# Patient Record
Sex: Male | Born: 1960 | Race: White | Hispanic: No | Marital: Married | Smoking: Never smoker
Health system: Southern US, Community
[De-identification: ages and names within clinical notes are randomized; demographics above are authoritative.]

## PROBLEM LIST (undated history)

## (undated) DIAGNOSIS — R079 Chest pain, unspecified: Secondary | ICD-10-CM

## (undated) DIAGNOSIS — G4733 Obstructive sleep apnea (adult) (pediatric): Secondary | ICD-10-CM

## (undated) DIAGNOSIS — R7303 Prediabetes: Secondary | ICD-10-CM

## (undated) DIAGNOSIS — E119 Type 2 diabetes mellitus without complications: Secondary | ICD-10-CM

## (undated) DIAGNOSIS — N5202 Corporo-venous occlusive erectile dysfunction: Secondary | ICD-10-CM

## (undated) DIAGNOSIS — E782 Mixed hyperlipidemia: Secondary | ICD-10-CM

## (undated) HISTORY — DX: Corporo-venous occlusive erectile dysfunction: N52.02

## (undated) HISTORY — DX: Type 2 diabetes mellitus without complications: E11.9

## (undated) HISTORY — DX: Morbid (severe) obesity due to excess calories: E66.01

## (undated) HISTORY — DX: Mixed hyperlipidemia: E78.2

## (undated) HISTORY — DX: Obstructive sleep apnea (adult) (pediatric): G47.33

## (undated) HISTORY — PX: NASAL SEPTUM SURGERY: SHX37

## (undated) HISTORY — DX: Chest pain, unspecified: R07.9

## (undated) HISTORY — PX: DENTAL SURGERY: SHX609

---

## 2014-05-21 DIAGNOSIS — G4733 Obstructive sleep apnea (adult) (pediatric): Secondary | ICD-10-CM

## 2014-05-21 DIAGNOSIS — E782 Mixed hyperlipidemia: Secondary | ICD-10-CM

## 2014-05-21 HISTORY — DX: Morbid (severe) obesity due to excess calories: E66.01

## 2014-05-21 HISTORY — DX: Obstructive sleep apnea (adult) (pediatric): G47.33

## 2014-05-21 HISTORY — DX: Mixed hyperlipidemia: E78.2

## 2014-06-04 DIAGNOSIS — E119 Type 2 diabetes mellitus without complications: Secondary | ICD-10-CM | POA: Insufficient documentation

## 2014-06-04 HISTORY — DX: Type 2 diabetes mellitus without complications: E11.9

## 2015-04-21 DIAGNOSIS — N5202 Corporo-venous occlusive erectile dysfunction: Secondary | ICD-10-CM

## 2015-04-21 HISTORY — DX: Corporo-venous occlusive erectile dysfunction: N52.02

## 2017-10-16 ENCOUNTER — Encounter (HOSPITAL_COMMUNITY): Payer: Self-pay | Admitting: Emergency Medicine

## 2017-10-16 ENCOUNTER — Ambulatory Visit (INDEPENDENT_AMBULATORY_CARE_PROVIDER_SITE_OTHER)
Admission: EM | Admit: 2017-10-16 | Discharge: 2017-10-16 | Disposition: A | Discharge status: Home / Self Care | Payer: BLUE CROSS/BLUE SHIELD | Source: Home / Self Care

## 2017-10-16 ENCOUNTER — Emergency Department (HOSPITAL_COMMUNITY): Payer: BLUE CROSS/BLUE SHIELD

## 2017-10-16 ENCOUNTER — Other Ambulatory Visit: Payer: Self-pay

## 2017-10-16 ENCOUNTER — Emergency Department (HOSPITAL_COMMUNITY)
Admission: EM | Admit: 2017-10-16 | Discharge: 2017-10-17 | Disposition: A | Discharge status: Home / Self Care | Payer: BLUE CROSS/BLUE SHIELD | Attending: Emergency Medicine | Admitting: Emergency Medicine

## 2017-10-16 DIAGNOSIS — R079 Chest pain, unspecified: Secondary | ICD-10-CM | POA: Insufficient documentation

## 2017-10-16 DIAGNOSIS — M79602 Pain in left arm: Secondary | ICD-10-CM

## 2017-10-16 HISTORY — DX: Prediabetes: R73.03

## 2017-10-16 LAB — CBC
HCT: 48 % (ref 39.0–52.0)
Hemoglobin: 15.8 g/dL (ref 13.0–17.0)
MCH: 28.6 pg (ref 26.0–34.0)
MCHC: 32.9 g/dL (ref 30.0–36.0)
MCV: 86.8 fL (ref 78.0–100.0)
Platelets: 235 10*3/uL (ref 150–400)
RBC: 5.53 MIL/uL (ref 4.22–5.81)
RDW: 13.4 % (ref 11.5–15.5)
WBC: 8.2 10*3/uL (ref 4.0–10.5)

## 2017-10-16 LAB — BASIC METABOLIC PANEL
Anion gap: 13 (ref 5–15)
BUN: 10 mg/dL (ref 6–20)
CHLORIDE: 101 mmol/L (ref 98–111)
CO2: 24 mmol/L (ref 22–32)
CREATININE: 0.86 mg/dL (ref 0.61–1.24)
Calcium: 9.6 mg/dL (ref 8.9–10.3)
GFR calc non Af Amer: >60 mL/min (ref >60)
Glucose, Bld: 214 mg/dL — ABNORMAL HIGH (ref 70–99)
Potassium: 4 mmol/L (ref 3.5–5.1)
Sodium: 138 mmol/L (ref 135–145)

## 2017-10-16 LAB — I-STAT TROPONIN, ED: Troponin i, poc: 0.03 ng/mL (ref 0.00–0.08)

## 2017-10-16 NOTE — ED Provider Notes (Signed)
Patient placed in Quick Look pathway, seen and evaluated   Chief Complaint: chest pain  HPI:   Presents with acute onset left sided chest pain radiating to left arm beginning this morning around 9am at rest. Pain is described as cramping, tightness. Lightheadedness and diaphoresis, no syncope. Endorses SOB, no nausea or vomiting. Denies fevers. Non-smoker. Has taken 4 full size ASA with improvement.  Denies leg swelling.  No risk factors for DVT.  ROS:   Physical Exam:   Gen: No distress  Neuro: Awake and Alert  Skin: Warm    Focused Exam: Heart regular rate and rhythm.  No murmurs rubs or gallops.  Lungs clear to auscultation bilaterally.  No tenderness to palpation of the chest wall. 2+ radial and DP/PT pulses bilaterally, Homans sign absent bilaterally, no lower extremity edema, no palpable cords, compartments are soft   Initiation of care has begun. The patient has been counseled on the process, plan, and necessity for staying for the completion/evaluation, and the remainder of the medical screening examination    Bennye Alm 10/16/17 1707    Lorre Nick, MD 10/17/17 [phone]

## 2017-10-16 NOTE — ED Provider Notes (Signed)
[facility] EMERGENCY DEPARTMENT Provider Note   CSN: [id number] Arrival date & time: 10/16/17  1645     History   Chief Complaint Chief Complaint  Patient presents with  . Chest Pain    HPI Skyy Messerly is a 57 y.o. male.  Patient is a 57 year old male with no significant past medical history.  He presents for evaluation of chest discomfort.  This started approximately 9 AM this morning shortly after he arrived to work.  He reports a cramping in the left center of his chest with radiation into his left arm.  He reports pain in his left calf that has been ongoing for the past several weeks.  Denies any recent exertional symptoms.  He works as a Investment banker, corporate.  He reports this is a stressful job, but is not physically demanding.  He does report having a stress test 4 or 5 years ago that was unremarkable.  The history is provided by the patient.  Chest Pain   This is a new problem. Episode onset: 9AM. The problem occurs constantly. The problem has been gradually improving. The pain is associated with breathing and movement. The pain is present in the substernal region. The pain is moderate. Quality: cramping. The pain radiates to the left arm. Pertinent negatives include no diaphoresis, no fever, no palpitations and no shortness of breath. He has tried nothing for the symptoms. The treatment provided no relief. There are no known risk factors.    Past Medical History:  Diagnosis Date  . Prediabetes     There are no active problems to display for this patient.   History reviewed. No pertinent surgical history.      Home Medications    Prior to Admission medications   Not on File    Family History No family history on file.  Social History Social History   Tobacco Use  . Smoking status: Never Smoker  . Smokeless tobacco: Never Used  Substance Use Topics  . Alcohol use: Never    Frequency: Never  . Drug use: Never     Allergies     Patient has no known allergies.   Review of Systems Review of Systems  Constitutional: Negative for diaphoresis and fever.  Respiratory: Negative for shortness of breath.   Cardiovascular: Positive for chest pain. Negative for palpitations.  All other systems reviewed and are negative.    Physical Exam Updated Vital Signs BP (!) 151/90 (BP Location: Left Arm)   Pulse 69   Temp 97.6 F (36.4 C) (Oral)   Resp 19   Ht 6\' 1"  (1.854 m)   Wt 136.1 kg   SpO2 96%   BMI 39.58 kg/m   Physical Exam  Constitutional: He is oriented to person, place, and time. He appears well-developed and well-nourished. No distress.  HENT:  Head: Normocephalic and atraumatic.  Mouth/Throat: Oropharynx is clear and moist.  Neck: Normal range of motion. Neck supple.  Cardiovascular: Normal rate and regular rhythm. Exam reveals no friction rub.  No murmur heard. Pulmonary/Chest: Effort normal and breath sounds normal. No respiratory distress. He has no wheezes. He has no rales.  Abdominal: Soft. Bowel sounds are normal. He exhibits no distension. There is no tenderness.  Musculoskeletal: Normal range of motion. He exhibits no edema.       Right lower leg: Normal. He exhibits no tenderness and no edema.       Left lower leg: Normal. He exhibits tenderness. He exhibits no edema.  There is  tenderness in the left leg.  Homans sign is absent.  Neurological: He is alert and oriented to person, place, and time. Coordination normal.  Skin: Skin is warm and dry. He is not diaphoretic.  Nursing note and vitals reviewed.    ED Treatments / Results  Labs (all labs ordered are listed, but only abnormal results are displayed) Labs Reviewed  BASIC METABOLIC PANEL - Abnormal; Notable for the following components:      Result Value   Glucose, Bld 214 (*)    All other components within normal limits  CBC  D-DIMER, QUANTITATIVE (NOT AT [facility])  I-STAT TROPONIN, ED  I-STAT TROPONIN, ED    EKG EKG  Interpretation  Date/Time:  Tuesday October 16 2017 16:53:27 EDT Ventricular Rate:  80 PR Interval:  162 QRS Duration: 80 QT Interval:  346 QTC Calculation: 399 R Axis:   24 Text Interpretation:  Normal sinus rhythm Normal ECG Confirmed by Geoffery Lyons ([id number]) on 10/16/2017 11:35:51 PM   Radiology Dg Chest 2 View  Result Date: 10/16/2017 CLINICAL DATA:  Chest pain EXAM: CHEST - 2 VIEW COMPARISON:  None. FINDINGS: There is slight atelectatic change in the left lower lobe. There is no edema or consolidation. The heart size and pulmonary vascularity are normal. No adenopathy. No pneumothorax. No bone lesions. IMPRESSION: Slight atelectasis left lower lobe.  No edema or consolidation. Electronically Signed   By: Bretta Bang III M.D.   On: 10/16/2017 17:40    Procedures Procedures (including critical care time)  Medications Ordered in ED Medications - No data to display   Initial Impression / Assessment and Plan / ED Course  I have reviewed the triage vital signs and the nursing notes.  Pertinent labs & imaging results that were available during my care of the patient were reviewed by me and considered in my medical decision making (see chart for details).  Patient is a 57 year old male with no prior cardiac history presenting with complaints of left-sided chest discomfort that is been ongoing since earlier this morning.  He describes this as a cramping with no shortness of breath, diaphoresis, nausea.    His work-up reveals no obvious abnormality including EKG, troponin x2, and d-dimer.  There is no hypoxia or tachycardia and I highly doubt PE.  At this point, I feel as though the patient is appropriate for discharge.  He has had a stress test several years ago and may benefit from outpatient cardiology follow-up.  He will be given the number for the [facility] health cardiology clinic to arrange outpatient follow-up.  He understands to return in the meantime if symptoms worsen or  change.  Final Clinical Impressions(s) / ED Diagnoses   Final diagnoses:  None    ED Discharge Orders    None       Geoffery Lyons, MD 10/17/17 0120

## 2017-10-16 NOTE — ED Triage Notes (Signed)
Pt reports left cp and cramping that radiated to left arm that started this morning around 9am. Pt also reports left calf cramping that has been going on for two weeks and pain in leg today. Pt reports taking about 1300mg  of asa throughout the day.

## 2017-10-16 NOTE — ED Triage Notes (Signed)
Pt c/o chest pain, cramping, L arm and leg pain. Family hx of blood clots. EKG obtained, given to Dr. Tracie Harrier, sent to ER.

## 2017-10-17 LAB — D-DIMER, QUANTITATIVE: D-Dimer, Quant: <0.27 ug/mL-FEU (ref 0.00–0.50)

## 2017-10-17 LAB — I-STAT TROPONIN, ED: Troponin i, poc: 0.03 ng/mL (ref 0.00–0.08)

## 2017-10-17 NOTE — Discharge Instructions (Addendum)
Ibuprofen 600 mg 3 times daily for the next 3 days.  Rest.  Follow-up with cardiology to discuss possible stress testing.  The contact information for the [facility] health radiology group has been provided in this discharge summary for you to call and make these arrangements.  Return to the ER in the meantime if your symptoms significantly worsen or change.

## 2017-10-17 NOTE — ED Notes (Signed)
Pt unable to sign for discharge 

## 2017-10-23 DIAGNOSIS — R079 Chest pain, unspecified: Secondary | ICD-10-CM | POA: Insufficient documentation

## 2017-10-23 HISTORY — DX: Chest pain, unspecified: R07.9

## 2017-10-23 NOTE — Progress Notes (Deleted)
Cardiology Office Note:    Date:  10/23/2017   ID:  Nathan Blankenship, DOB 1960/06/17, MRN [id number]  PCP:  Aubery Lapping, PA  Cardiologist:  Norman Herrlich, MD   Referring MD: Aubery Lapping, PA  ASSESSMENT:    No diagnosis found. PLAN:    In order of problems listed above:  1. ***  Next appointment   Medication Adjustments/Labs and Tests Ordered: Current medicines are reviewed at length with the patient today.  Concerns regarding medicines are outlined above.  No orders of the defined types were placed in this encounter.  No orders of the defined types were placed in this encounter.    No chief complaint on file. ***  History of Present Illness:    Nathan Blankenship is a 57 y.o. male who is being seen today for the evaluation of *** at the request of Aubery Lapping, PA. He was seen at [facility] emergency room 10/16/2017 for what was felt to be nonanginal chest pain his EKG was normal cardiac troponins were normal x2 and was discharged from the emergency room.  D-dimer is also normal felt to be at low risk for pulmonary embolism  Past Medical History:  Diagnosis Date  . Prediabetes     No past surgical history on file.  Current Medications: No outpatient medications have been marked as taking for the 10/24/17 encounter (Appointment) with Baldo Daub, MD.     Allergies:   Patient has no known allergies.   Social History   Socioeconomic History  . Marital status: Married    Spouse name: Not on file  . Number of children: Not on file  . Years of education: Not on file  . Highest education level: Not on file  Occupational History  . Not on file  Social Needs  . Financial resource strain: Not on file  . Food insecurity:    Worry: Not on file    Inability: Not on file  . Transportation needs:    Medical: Not on file    Non-medical: Not on file  Tobacco Use  . Smoking status: Never Smoker  . Smokeless tobacco: Never Used    Substance and Sexual Activity  . Alcohol use: Never    Frequency: Never  . Drug use: Never  . Sexual activity: Not on file  Lifestyle  . Physical activity:    Days per week: Not on file    Minutes per session: Not on file  . Stress: Not on file  Relationships  . Social connections:    Talks on phone: Not on file    Gets together: Not on file    Attends religious service: Not on file    Active member of club or organization: Not on file    Attends meetings of clubs or organizations: Not on file    Relationship status: Not on file  Other Topics Concern  . Not on file  Social History Narrative  . Not on file     Family History: The patient's ***family history is not on file.  ROS:   ROS Please see the history of present illness.    *** All other systems reviewed and are negative.  EKGs/Labs/Other Studies Reviewed:    The following studies were reviewed today: ***  EKG:  EKG is *** ordered today.  The ekg ordered today demonstrates ***  Recent Labs: 10/16/2017: BUN 10; Creatinine, Ser 0.86; Hemoglobin 15.8; Platelets 235; Potassium 4.0; Sodium 138  Recent Lipid Panel No  results found for: CHOL, TRIG, HDL, CHOLHDL, VLDL, LDLCALC, LDLDIRECT  Physical Exam:    VS:  There were no vitals taken for this visit.    Wt Readings from Last 3 Encounters:  10/16/17 300 lb (136.1 kg)     GEN: *** Well nourished, well developed in no acute distress HEENT: Normal NECK: No JVD; No carotid bruits LYMPHATICS: No lymphadenopathy CARDIAC: ***RRR, no murmurs, rubs, gallops RESPIRATORY:  Clear to auscultation without rales, wheezing or rhonchi  ABDOMEN: Soft, non-tender, non-distended MUSCULOSKELETAL:  No edema; No deformity  SKIN: Warm and dry NEUROLOGIC:  Alert and oriented x 3 PSYCHIATRIC:  Normal affect     Signed, Norman HerrlichBrian Zylie Mumaw, MD  10/23/2017 9:19 AM    [facility]

## 2017-10-24 ENCOUNTER — Ambulatory Visit: Payer: BLUE CROSS/BLUE SHIELD | Admitting: Cardiology

## 2017-10-24 NOTE — Progress Notes (Signed)
Cardiology Office Note:    Date:  10/25/2017   ID:  Nathan Adamerrance Awad, DOB 1960-12-20, MRN [id number]  PCP:  Aubery LappingGeigler, Bryan James, PA  Cardiologist:  Norman HerrlichBrian Esti Demello, MD   Referring MD: Aubery LappingGeigler, Bryan James, [place]  ASSESSMENT:    1. Chest pain in adult   2. Type 2 diabetes mellitus without complication, without long-term current use of insulin (HCC)   3. Multiple-type hyperlipidemia    PLAN:    In order of problems listed above:  1. His symptoms were atypical somewhat pleuritic in retrospect likely likely part of his upper respiratory infection.  He is at high risk of coronary artery disease I asked him to undergo stress echo and check laboratory test including lipids with a history of hyperlipidemia hemoglobin A1c and a sedimentation rate regarding pleuritic chest pain.  He does not have risk factors and I do not think he needs to undergo evaluation for venous thromboembolism. 2. He has had no follow-up his blood sugars over 200 in the ED and will check a hemoglobin A1c. 3. Untreated check lipid profile  Next appointment as needed providing the stress test is normal.  If he requires lipid-lowering therapy treatment for diabetes I will initiate and refer to PCP   Medication Adjustments/Labs and Tests Ordered: Current medicines are reviewed at length with the patient today.  Concerns regarding medicines are outlined above.  Orders Placed This Encounter  Procedures  . HgB A1c  . Lipid Profile  . Sedimentation rate  . EKG 12-Lead  . ECHOCARDIOGRAM STRESS TEST   No orders of the defined types were placed in this encounter.    No chief complaint on file.   History of Present Illness:    Nathan Blankenship is a 57 y.o. male who is being seen today for the evaluation of chest pain at the request of Aubery LappingGeigler, Bryan James, PA. [facility] 11/04/2017 was felt to be nonanginal chest pain and discharged after normal EKG to troponin and d-dimer His symptoms are  nonanginal but it occurred at rest varying locations in the chest and a pleuritic component.  Soon afterwards he developed congestion myalgias and a productive cough he has recovered and has had no further chest pain.  Approximately 6 years ago had a stress echo performed in [place] normal for chest pain said no anginal discomfort dyspnea palpitations syncope or TIA.  He has no history of congenital rheumatic heart disease.  Unfortunately had a gap in care he is well overdue for labs including a lipid profile with dyslipidemia not on statin and diabetes presently not on treatment.  I will check these labs and if he requires treatment I will initiate and refer him to PCP after discussion of benefits and options he elects to undergo stress test stress echocardiogram.  If high risk markers would benefit from coronary angiography and revascularization. Past Medical History:  Diagnosis Date  . Chest pain in adult 10/23/2017  . 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 Morbid obesity (HCC) 05/21/2014  . Multiple-type hyperlipidemia 05/21/2014  . Obstructive sleep apnea syndrome 05/21/2014  . Prediabetes   . Type 2 diabetes mellitus without complication (HCC) 06/04/2014    Past Surgical History:  Procedure Laterality Date  . DENTAL SURGERY    . NASAL SEPTUM SURGERY      Current Medications: Current Meds  Medication Sig  . aspirin 325 MG tablet Take 325 mg by mouth daily.  . fexofenadine (ALLEGRA) 180 MG tablet Take 180  mg by mouth daily.  . fluticasone (FLONASE) 50 MCG/ACT nasal spray Place 1 spray into both nostrils daily.  Marland Kitchen omeprazole (PRILOSEC) 20 MG capsule Take 20 mg by mouth daily.     Allergies:   Patient has no known allergies.   Social History   Socioeconomic History  . Marital status: Married    Spouse name: Not on file  . Number of children: Not on file  . Years of education: Not on file  . Highest education level: Not on file  Occupational History    . Not on file  Social Needs  . Financial resource strain: Not on file  . Food insecurity:    Worry: Not on file    Inability: Not on file  . Transportation needs:    Medical: Not on file    Non-medical: Not on file  Tobacco Use  . Smoking status: Never Smoker  . Smokeless tobacco: Never Used  Substance and Sexual Activity  . Alcohol use: Never    Frequency: Never  . Drug use: Never  . Sexual activity: Not on file  Lifestyle  . Physical activity:    Days per week: Not on file    Minutes per session: Not on file  . Stress: Not on file  Relationships  . Social connections:    Talks on phone: Not on file    Gets together: Not on file    Attends religious service: Not on file    Active member of club or organization: Not on file    Attends meetings of clubs or organizations: Not on file    Relationship status: Not on file  Other Topics Concern  . Not on file  Social History Narrative  . Not on file     Family History: The patient's family history includes Arrhythmia in his brother; Congestive Heart Failure in his maternal grandmother; Deep vein thrombosis in his paternal grandmother; Heart attack in his maternal grandmother; Hyperlipidemia in his mother; Hypertension in his mother.  ROS:   Review of Systems  Constitution: Positive for malaise/fatigue.  HENT: Positive for congestion.   Eyes: Negative.   Cardiovascular: Positive for chest pain.  Respiratory: Positive for cough and sputum production.   Endocrine: Negative.   Hematologic/Lymphatic: Negative.   Skin: Negative.   Musculoskeletal: Negative.   Gastrointestinal: Negative.   Neurological: Positive for dizziness.  Psychiatric/Behavioral: Negative.   Allergic/Immunologic: Negative.    Please see the history of present illness.     All other systems reviewed and are negative.  EKGs/Labs/Other Studies Reviewed:    The following studies were reviewed today: Prior to visit ED records reviewed  EKG:  EKG is   ordered today.  The ekg ordered today demonstrates sinus rhythm and remains normal  Recent Labs: 10/16/2017: BUN 10; Creatinine, Ser 0.86; Hemoglobin 15.8; Platelets 235; Potassium 4.0; Sodium 138  Recent Lipid Panel No results found for: CHOL, TRIG, HDL, CHOLHDL, VLDL, LDLCALC, LDLDIRECT  Physical Exam:    VS:  BP (!) 124/92 (BP Location: Right Arm, Patient Position: Sitting, Cuff Size: Large)   Pulse 80   Ht 6\' 1"  (1.854 m)   Wt (!) 300 lb 12.8 oz (136.4 kg)   SpO2 98%   BMI 39.69 kg/m     Wt Readings from Last 3 Encounters:  10/25/17 (!) 300 lb 12.8 oz (136.4 kg)  10/16/17 300 lb (136.1 kg)     GEN:  Well nourished, well developed in no acute distress HEENT: Normal NECK: No JVD; No  carotid bruits LYMPHATICS: No lymphadenopathy CARDIAC: RRR, no murmurs, rubs, gallops he has no chest wall tenderness RESPIRATORY:  Clear to auscultation without rales, wheezing or rhonchi  ABDOMEN: Soft, non-tender, non-distended MUSCULOSKELETAL:  No edema; No deformity  SKIN: Warm and dry NEUROLOGIC:  Alert and oriented x 3 PSYCHIATRIC:  Normal affect     Signed, Norman Herrlich, MD  10/25/2017 1:05 PM    [facility]

## 2017-10-25 ENCOUNTER — Ambulatory Visit (INDEPENDENT_AMBULATORY_CARE_PROVIDER_SITE_OTHER): Payer: BLUE CROSS/BLUE SHIELD | Admitting: Cardiology

## 2017-10-25 ENCOUNTER — Encounter: Payer: Self-pay | Admitting: Cardiology

## 2017-10-25 VITALS — BP 124/92 | HR 80 | Ht 73.0 in | Wt 300.8 lb

## 2017-10-25 DIAGNOSIS — E119 Type 2 diabetes mellitus without complications: Secondary | ICD-10-CM

## 2017-10-25 DIAGNOSIS — R079 Chest pain, unspecified: Secondary | ICD-10-CM

## 2017-10-25 DIAGNOSIS — E782 Mixed hyperlipidemia: Secondary | ICD-10-CM | POA: Diagnosis not present

## 2017-10-25 NOTE — Patient Instructions (Signed)
Medication Instructions:  Your physician recommends that you continue on your current medications as directed. Please refer to the Current Medication list given to you today.  Labwork: Your physician recommends that you return for lab work today: lipid panel, sedimentation rate, Hemoglobin A1c.   Testing/Procedures: You had an EKG today.   Your physician has requested that you have a stress echocardiogram. For further information please visit [email or web address]. Please follow instruction sheet as given.  Follow-Up: Your physician recommends that you schedule a follow-up appointment as needed if symptoms worsen or fail to improve.   If you need a refill on your cardiac medications before your next appointment, please call your pharmacy.   Thank you for choosing [facility]! Mady GemmaCatherine Lockhart, RN [phone]    Exercise Stress Electrocardiogram An exercise stress electrocardiogram is a test to check how blood flows to your heart. It is done to find areas of poor blood flow. You will need to walk on a treadmill for this test. The electrocardiogram will record your heartbeat when you are at rest and when you are exercising. What happens before the procedure?  Do not have drinks with caffeine or foods with caffeine for 24 hours before the test, or as told by your doctor. This includes coffee, tea (even decaf tea), sodas, chocolate, and cocoa.  Follow your doctor's instructions about eating and drinking before the test.  Ask your doctor what medicines you should or should not take before the test. Take your medicines with water unless told by your doctor not to.  If you use an inhaler, bring it with you to the test.  Bring a snack to eat after the test.  Do not  smoke for 4 hours before the test.  Do not put lotions, powders, creams, or oils on your chest before the test.  Wear comfortable shoes and clothing. What happens during the procedure?  You will have patches put on  your chest. Small areas of your chest may need to be shaved. Wires will be connected to the patches.  Your heart rate will be watched while you are resting and while you are exercising.  You will walk on the treadmill. The treadmill will slowly get faster to raise your heart rate.  The test will take about 1-2 hours. What happens after the procedure?  Your heart rate and blood pressure will be watched after the test.  You may return to your normal diet, activities, and medicines or as told by your doctor. This information is not intended to replace advice given to you by your health care provider. Make sure you discuss any questions you have with your health care provider. Document Released: 07/12/2007 Document Revised: 09/22/2015 Document Reviewed: 09/30/2012 Elsevier Interactive Patient Education  [employer]2018 Elsevier Inc.

## 2017-10-26 ENCOUNTER — Telehealth: Payer: Self-pay | Admitting: *Deleted

## 2017-10-26 ENCOUNTER — Telehealth: Payer: Self-pay

## 2017-10-26 DIAGNOSIS — R700 Elevated erythrocyte sedimentation rate: Secondary | ICD-10-CM

## 2017-10-26 LAB — LIPID PANEL
CHOLESTEROL TOTAL: 190 mg/dL (ref 100–199)
Chol/HDL Ratio: 4.8 ratio (ref 0.0–5.0)
HDL: 40 mg/dL (ref >39)
LDL Calculated: 108 mg/dL — ABNORMAL HIGH (ref 0–99)
Triglycerides: 212 mg/dL — ABNORMAL HIGH (ref 0–149)
VLDL Cholesterol Cal: 42 mg/dL — ABNORMAL HIGH (ref 5–40)

## 2017-10-26 LAB — HEMOGLOBIN A1C
ESTIMATED AVERAGE GLUCOSE: 249 mg/dL
HEMOGLOBIN A1C: 10.3 % — AB (ref 4.8–5.6)

## 2017-10-26 LAB — SEDIMENTATION RATE: SED RATE: 54 mm/h — AB (ref 0–30)

## 2017-10-26 MED ORDER — METFORMIN HCL 500 MG PO TABS: 500.0000 mg | ORAL_TABLET | Freq: Two times a day (BID) | ORAL | 0 refills | Status: AC

## 2017-10-26 MED ORDER — ROSUVASTATIN CALCIUM 5 MG PO TABS: 5.0000 mg | ORAL_TABLET | Freq: Every day | ORAL | 0 refills | Status: DC

## 2017-10-26 NOTE — Telephone Encounter (Signed)
-----   Message from Baldo DaubBrian J Munley, MD sent at 10/26/2017  8:26 AM EDT ----- Hgb A1c is very elevated needs to see a PCP if none use LB on 2nd floor  Sed rate is elevated, I think he had pleurisy and would reheck in 2 weeks  He should start a statin with DM  Rosuvastatin 5 mg  #30 no refill  Start metformin 500 mg  BID # 60 no refill

## 2017-10-26 NOTE — Telephone Encounter (Signed)
Patient informed of lab results and advised to follow up with PCP due to elevated hemoglobin A1c. Prescription for metformin and rosuvastatin (crestor) sent to [facility] in [place]. Advised patient to come back to our office in 2 weeks to recheck sedimentation rate. Patient verbalized understanding. No further questions.

## 2017-10-26 NOTE — Telephone Encounter (Signed)
Left message for patient to return call.

## 2017-11-06 ENCOUNTER — Ambulatory Visit (HOSPITAL_BASED_OUTPATIENT_CLINIC_OR_DEPARTMENT_OTHER)
Admission: RE | Admit: 2017-11-06 | Discharge: 2017-11-06 | Disposition: A | Discharge status: Home / Self Care | Payer: BLUE CROSS/BLUE SHIELD | Source: Ambulatory Visit | Attending: Cardiology | Admitting: Cardiology

## 2017-11-06 DIAGNOSIS — E119 Type 2 diabetes mellitus without complications: Secondary | ICD-10-CM | POA: Insufficient documentation

## 2017-11-06 DIAGNOSIS — R079 Chest pain, unspecified: Secondary | ICD-10-CM | POA: Diagnosis present

## 2017-11-06 NOTE — Progress Notes (Signed)
  Echocardiogram Echocardiogram Stress Test has been performed.  Lakrista Scaduto T Charizma Gardiner 11/06/2017, 10:47 AM

## 2017-11-30 ENCOUNTER — Other Ambulatory Visit: Payer: Self-pay

## 2017-11-30 MED ORDER — ROSUVASTATIN CALCIUM 5 MG PO TABS: 5.0000 mg | ORAL_TABLET | Freq: Every day | ORAL | 1 refills | Status: AC

## 2017-12-04 ENCOUNTER — Telehealth: Payer: Self-pay | Admitting: *Deleted

## 2017-12-04 NOTE — Telephone Encounter (Signed)
Abbie metformin is not something we fill, this must come from PCP.

## 2017-12-04 NOTE — Telephone Encounter (Signed)
*  STAT* If patient is at the pharmacy, call can be transferred to refill team.   1. Which medications need to be refilled? (please list name of each medication and dose if known) Metformin HCL 500 MG Tablets BID  2. Which pharmacy/location (including street and city if local pharmacy) is medication to be sent to?[facility] [place]  3. Do they need a 30 day or 90 day supply? 30 Day Supply

## 2017-12-07 NOTE — Telephone Encounter (Signed)
Would like ref[facility] to [facility] [place]. Pt promises to get in with PCP before runs out again.

## 2017-12-07 NOTE — Telephone Encounter (Signed)
Left message for patient advising that refills for metformin have to come from PCP. Advised him to contact our office with any further questions or concerns.

## 2018-01-22 ENCOUNTER — Other Ambulatory Visit: Payer: Self-pay | Admitting: Cardiology

## 2019-10-06 IMAGING — DX DG CHEST 2V
2 series · 2 of 2 positions shown · non-contrast
Comparison: None.

CLINICAL DATA: Chest pain

EXAM:
CHEST - 2 VIEW

[chest pa]
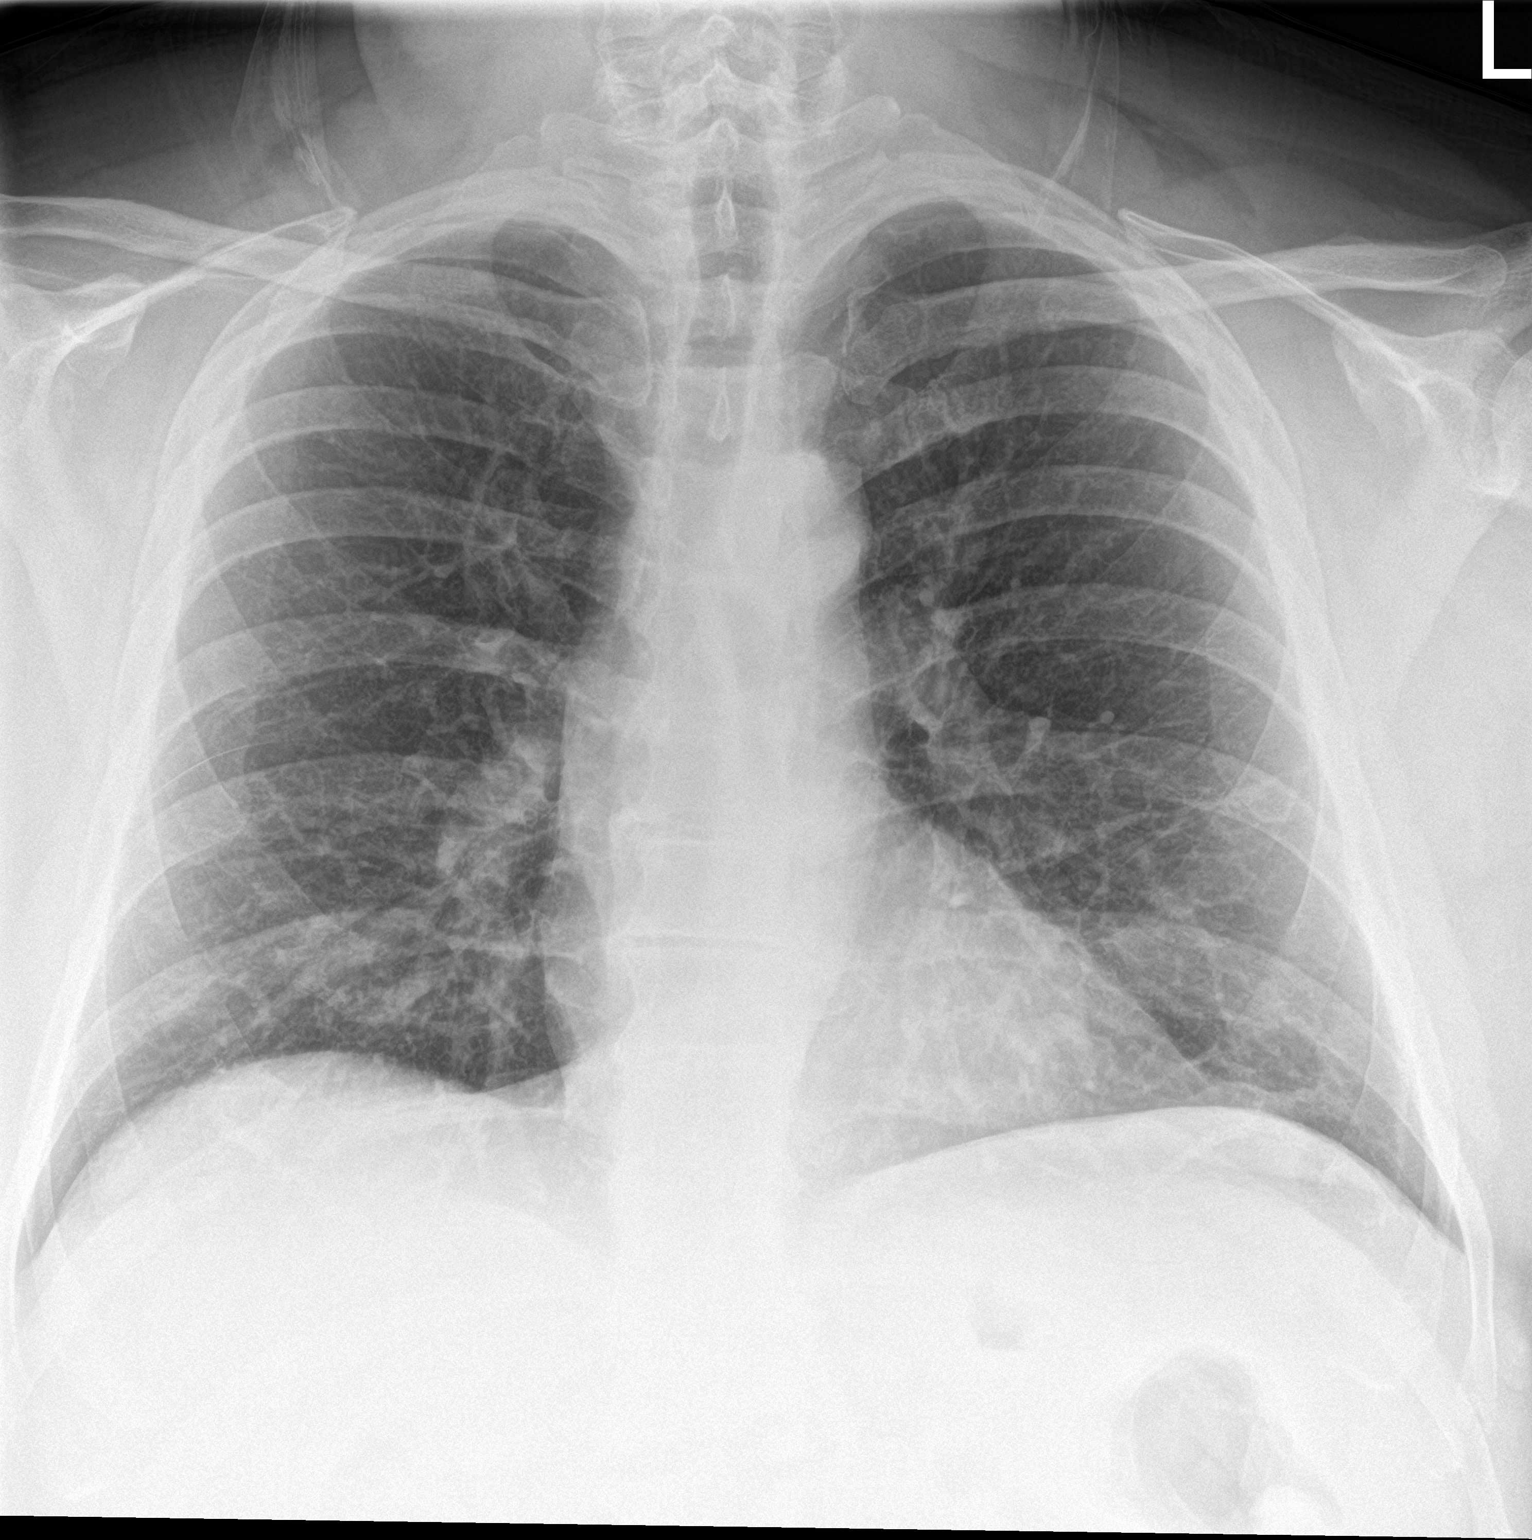

[chest lat]
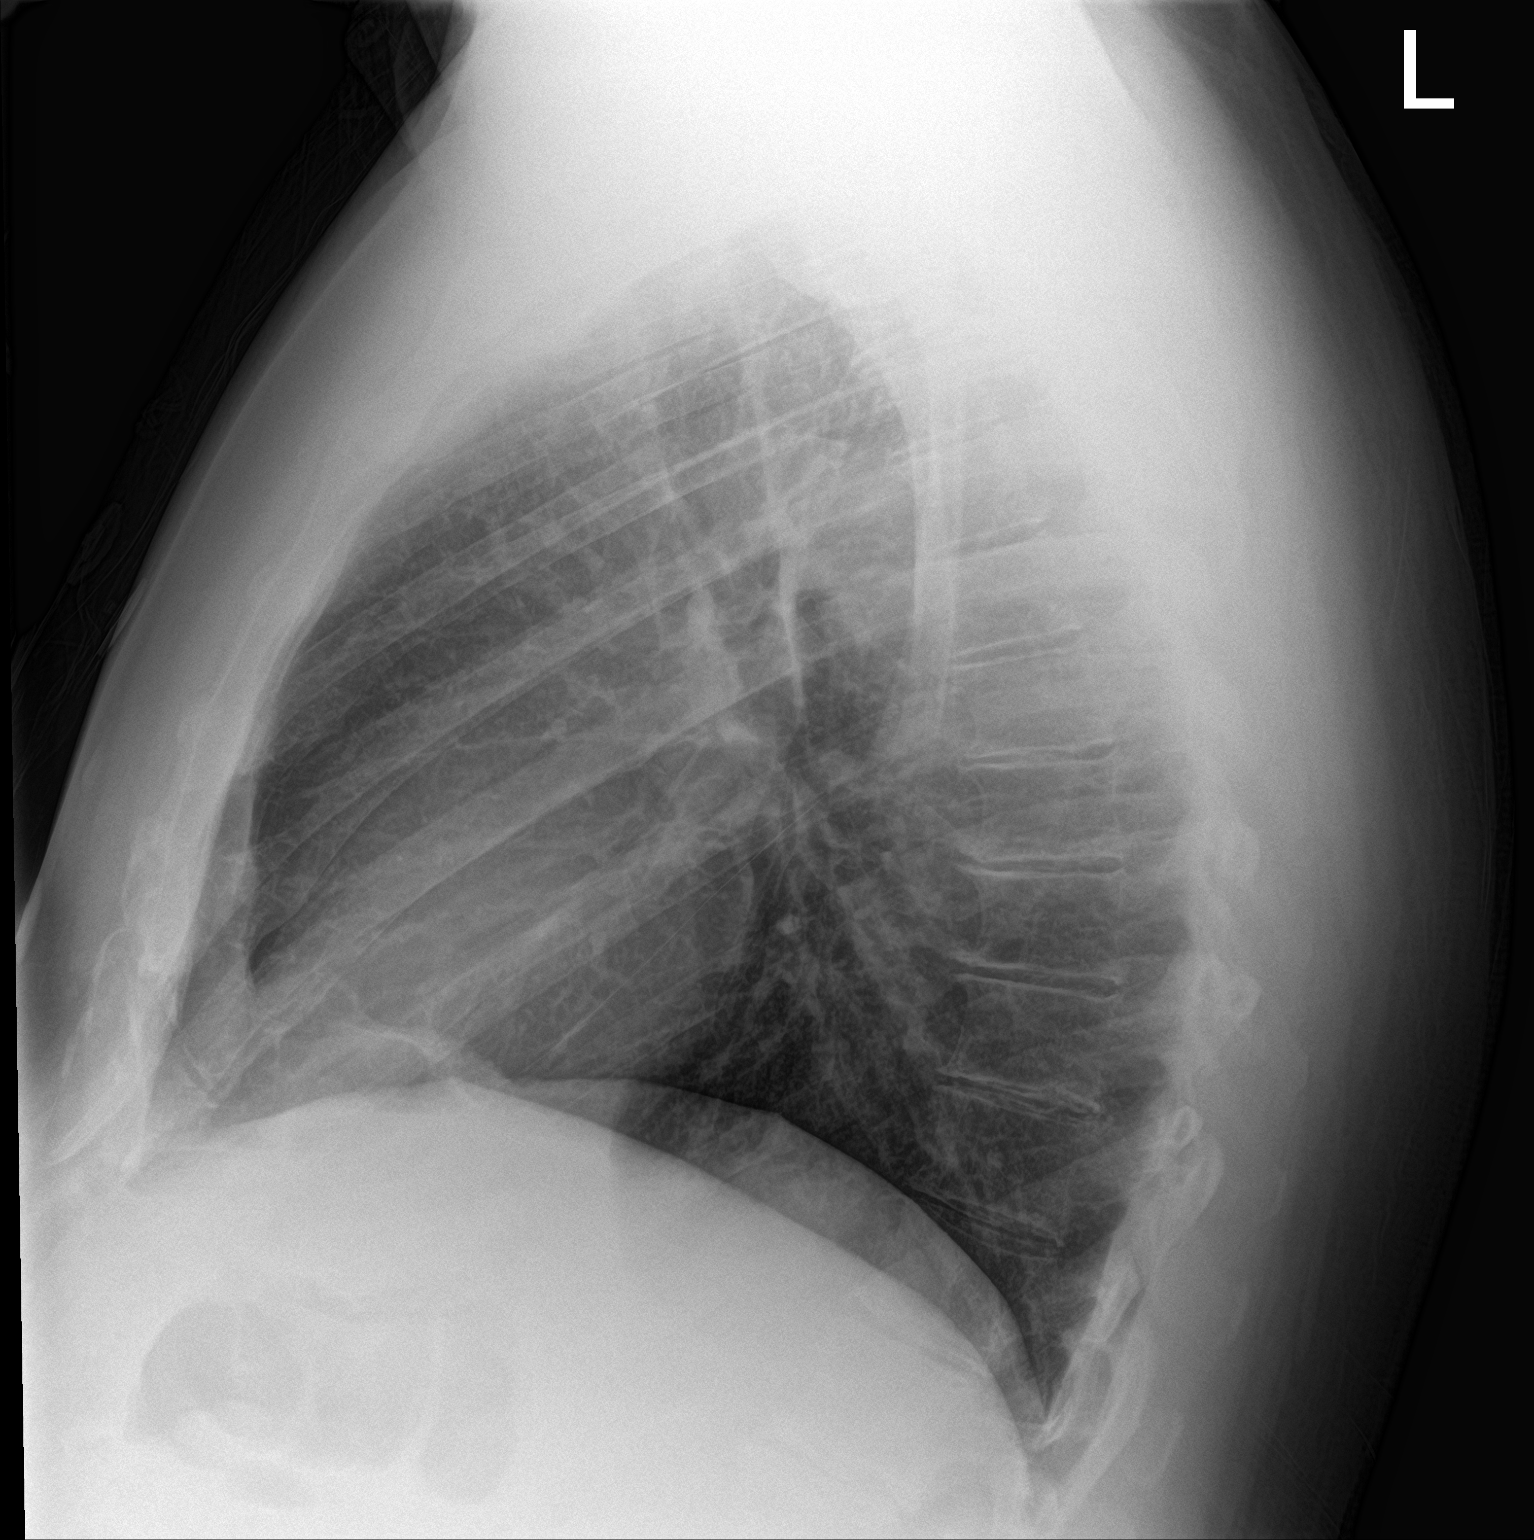

[2 of 2 positions shown; findings below may reference images not displayed]

FINDINGS: There is slight atelectatic change in the left lower lobe. There is
no edema or consolidation. The heart size and pulmonary vascularity
are normal. No adenopathy. No pneumothorax. No bone lesions.
IMPRESSION: Slight atelectasis left lower lobe.  No edema or consolidation.
